# Patient Record
Sex: Female | Born: 1970 | Race: White | Hispanic: No | Marital: Married | Smoking: Never smoker
Health system: Southern US, Community
[De-identification: ages and names within clinical notes are randomized; demographics above are authoritative.]

## PROBLEM LIST (undated history)

## (undated) DIAGNOSIS — M3390 Dermatopolymyositis, unspecified, organ involvement unspecified: Secondary | ICD-10-CM

## (undated) DIAGNOSIS — M3313 Other dermatomyositis without myopathy: Secondary | ICD-10-CM

## (undated) DIAGNOSIS — A6000 Herpesviral infection of urogenital system, unspecified: Secondary | ICD-10-CM

## (undated) DIAGNOSIS — G43909 Migraine, unspecified, not intractable, without status migrainosus: Secondary | ICD-10-CM

## (undated) DIAGNOSIS — Z01419 Encounter for gynecological examination (general) (routine) without abnormal findings: Secondary | ICD-10-CM

## (undated) HISTORY — DX: Other dermatomyositis without myopathy: M33.13

## (undated) HISTORY — DX: Encounter for gynecological examination (general) (routine) without abnormal findings: Z01.419

## (undated) HISTORY — DX: Migraine, unspecified, not intractable, without status migrainosus: G43.909

## (undated) HISTORY — DX: Herpesviral infection of urogenital system, unspecified: A60.00

## (undated) HISTORY — DX: Dermatopolymyositis, unspecified, organ involvement unspecified: M33.90

## (undated) HISTORY — PX: TUBAL LIGATION: SHX77

---
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
## 2007-05-14 HISTORY — PX: APPENDECTOMY: SHX54

## 2012-03-01 LAB — HM PAP SMEAR: HM Pap smear: NORMAL

## 2012-10-23 ENCOUNTER — Ambulatory Visit (INDEPENDENT_AMBULATORY_CARE_PROVIDER_SITE_OTHER): Payer: BC Managed Care – PPO | Admitting: Family Medicine

## 2012-10-23 ENCOUNTER — Encounter: Payer: Self-pay | Admitting: Family Medicine

## 2012-10-23 VITALS — BP 120/80 | Temp 99.2°F | Ht 66.5 in | Wt 148.0 lb

## 2012-10-23 DIAGNOSIS — Z7689 Persons encountering health services in other specified circumstances: Secondary | ICD-10-CM

## 2012-10-23 DIAGNOSIS — A6000 Herpesviral infection of urogenital system, unspecified: Secondary | ICD-10-CM

## 2012-10-23 DIAGNOSIS — Z7189 Other specified counseling: Secondary | ICD-10-CM

## 2012-10-23 DIAGNOSIS — R21 Rash and other nonspecific skin eruption: Secondary | ICD-10-CM

## 2012-10-23 MED ORDER — VALACYCLOVIR HCL 1 G PO TABS: 1000.0000 mg | ORAL_TABLET | Freq: Every day | ORAL | Status: DC

## 2012-10-23 MED ORDER — KETOCONAZOLE 2 % EX SHAM: MEDICATED_SHAMPOO | CUTANEOUS | Status: DC

## 2012-10-23 MED ORDER — BETAMETHASONE VALERATE 0.12 % EX FOAM: CUTANEOUS | Status: DC

## 2012-10-23 NOTE — Progress Notes (Signed)
Chief Complaint  Patient presents with  . Establish Care  . Allergic Reaction    HPI:  Brandi Bass is here to establish care. Recently moved from [place]. Last PCP and physical: 02/2012 - had pap and physical in [place] and all was normal. Basic labs then all ok as well per her report.   Has the following chronic problems and concerns today:  Rash in neck, upper back and scalp: -skin is dry and itchy skin -tx with steroid cream which helped a little, then had prednisone shot and this helped -laundry detergent - charlies's, soap is natural olive oil glycerin soap, free and clear shampoo and conditioner -has not seen a dermatologist - has appt with derm in a few weeks  There are no active problems to display for this patient.  Health Maintenance: -UTD  ROS: See pertinent positives and negatives per HPI.  Past Medical History  Diagnosis Date  . Migraines   . Genital herpes     Family History  Problem Relation Age of Onset  . Heart disease Father 23  . Kidney disease Mother     History   Social History  . Marital Status: Married    Spouse Name: N/A    Number of Children: N/A  . Years of Education: N/A   Social History Main Topics  . Smoking status: Never Smoker   . Smokeless tobacco: None  . Alcohol Use: Yes     Comment: occ, 1 drink rarely  . Drug Use: None  . Sexually Active: Yes     Comment: with spouse   Other Topics Concern  . None   Social History Narrative   Work or School: Emergency planning/management officer for IT for [employer] Situation: lives with spouse and 2 children (6 and 2 yo in 2014)      Spiritual Beliefs: Christian      Lifestyle: she runs and goes to [employer], diet is healthy             Current outpatient prescriptions:Betamethasone Valerate (LUXIQ) 0.12 % foam, Apply twice daily, Disp: 100 g, Rfl: 0;  ketoconazole (NIZORAL) 2 % shampoo, Apply topically 2 (two) times a week., Disp: 120 mL, Rfl: 0;  triamcinolone cream (KENALOG)  0.1 %, Apply 1 application topically 2 (two) times daily. , Disp: , Rfl: ;  valACYclovir (VALTREX) 1000 MG tablet, Take 1 tablet (1,000 mg total) by mouth daily., Disp: 20 tablet, Rfl: 0  EXAM:  Filed Vitals:   10/23/12 0824  BP: 120/80  Temp: 99.2 F (37.3 C)    Body mass index is 23.53 kg/(m^2).  GENERAL: vitals reviewed and listed above, alert, oriented, appears well hydrated and in no acute distress  HEENT: atraumatic, conjunttiva clear, no obvious abnormalities on inspection of external nose and ears  NECK: no obvious masses on inspection  LUNGS: clear to auscultation bilaterally, no wheezes, rales or rhonchi, good air movement  CV: HRRR, no peripheral edema  SKIN: dry scaly skin on scalp and neck  MS: moves all extremities without noticeable abnormality  PSYCH: pleasant and cooperative, no obvious depression or anxiety  ASSESSMENT AND PLAN:  Discussed the following assessment and plan:  Encounter to establish care  Herpes genitalia - Plan: valACYclovir (VALTREX) 1000 MG tablet  Skin rash - Plan: ketoconazole (NIZORAL) 2 % shampoo, Betamethasone Valerate (LUXIQ) 0.12 % foam  -We reviewed the PMH, PSH, FH, SH, Meds and Allergies. -We provided refills for any medications we will prescribe as  needed. -We addressed current concerns per orders and patient instructions. -We have asked for records for pertinent exams, studies, vaccines and notes from previous providers. -We have advised patient to follow up per instructions below. -refilled valtrex which she uses for genital herpes outbreaks -suspect form of excema or seb derm on scalp and neck, psoriasis less likely, tx per recs and orders and she has follow up with derm. -follow up for physical when due -Patient advised to return or notify a doctor immediately if symptoms worsen or persist or new concerns arise.  Patient Instructions  For skin: -only hypoallergenic products for washing and detergent  -medicated  shampoo twice weekly  -foam steroid twice daily  -use cerave cream or Cetaphil restoraderm moisturizer daily  -follow up with your dermatologist if skin issue does not resolve  -schedule physical in November or december     Brandi Bass, [place] R.

## 2012-10-23 NOTE — Patient Instructions (Addendum)
For skin: -only hypoallergenic products for washing and detergent  -medicated shampoo twice weekly  -foam steroid twice daily  -use cerave cream or Cetaphil restoraderm moisturizer daily  -follow up with your dermatologist if skin issue does not resolve  -schedule physical in November or december

## 2012-12-16 ENCOUNTER — Telehealth: Payer: Self-pay | Admitting: Family Medicine

## 2012-12-16 NOTE — Telephone Encounter (Signed)
PT states that she seen a dermatologist and that she hasn't improved. She states that she would like to see an Allergist, like she discussed with you. Please assist.

## 2012-12-18 NOTE — Telephone Encounter (Signed)
Called and left a message for pt to return call to give information for allergist.

## 2013-01-15 ENCOUNTER — Encounter: Payer: Self-pay | Admitting: Family Medicine

## 2013-01-15 NOTE — Progress Notes (Signed)
Received OV note from Dr. Irena Cords in allergy and asthma from 01/12/13. Placed in scan box.

## 2013-01-19 ENCOUNTER — Telehealth: Payer: Self-pay | Admitting: Family Medicine

## 2013-01-19 NOTE — Telephone Encounter (Signed)
Pt states that since May she has had a breakout in her scalp and on her back.  Pt was [facility] and then by Dr Selena Batten 10/21/12.  Pt was prescribed a cream but that did not help (itch would go away until time to reapply) Nothing is taking away the rash.  Pt has also been seen by a dermatologist and allergist but she has not gotten any answers.  Pt tried Lotrimin on her scalp last night and "got the best night sleep since May".  Pt has to wash it out of her hair and is not back itching and miserable again.  Pt wants to know if maybe this is fungal and an  anti fungal medication or cream could be prescribed.  Pt states it is still the same as when she was evaluated in June but it is starting to go to her forehead and chest.  OFFICE, please follow up with pt.  [facility] is [facility] [address]

## 2013-01-19 NOTE — Telephone Encounter (Signed)
We prescribe an antifungal shampoo at the first visit. I would advise she follow up with dermatologist if symptoms have persisted this long.

## 2013-01-20 NOTE — Telephone Encounter (Signed)
Left a detailed message for pt with Dr. Elmyra Ricks recommendations.

## 2013-02-01 ENCOUNTER — Encounter: Payer: Self-pay | Admitting: Family Medicine

## 2013-02-01 ENCOUNTER — Ambulatory Visit (INDEPENDENT_AMBULATORY_CARE_PROVIDER_SITE_OTHER): Payer: BC Managed Care – PPO | Admitting: Family Medicine

## 2013-02-01 VITALS — BP 124/90 | Temp 98.6°F | Wt 143.0 lb

## 2013-02-01 DIAGNOSIS — R21 Rash and other nonspecific skin eruption: Secondary | ICD-10-CM

## 2013-02-01 NOTE — Patient Instructions (Signed)
-  call your dermatologist for appointment and follow recommendations  -all hypoallergenic products  -We placed a referral for you as discussed to [facility]. It usually takes about 1-2 weeks to process and schedule this referral. If you have not heard from [place] regarding this appointment in 2 weeks please contact our office.

## 2013-02-01 NOTE — Progress Notes (Addendum)
Chief Complaint  Patient presents with  . skin irriation    HPI:   Acute visit for Skin Rash: -ongoing for 6 months -has seen derm twice (Dr. Linus Orn) -reports had scraping: has tried topical yeast treatments, topical antifungals, steroids, ketokonazole per derm [facility] does not seem like anything has helped -mostly on face, arms, neck and chest - itchy rash -using all hypoallergenic products -is going to see allergist for patch testing too -feels fine otherwise, denies: fevers, chills, malaise, breathing issues, other symptoms  ROS: See pertinent positives and negatives per HPI.  Past Medical History  Diagnosis Date  . Migraines   . Genital herpes     Past Surgical History  Procedure Laterality Date  . Appendectomy  2009  . Tubal ligation    . Cesarean section      Family History  Problem Relation Age of Onset  . Heart disease Father 77  . Kidney disease Mother     History   Social History  . Marital Status: Married    Spouse Name: N/A    Number of Children: N/A  . Years of Education: N/A   Social History Main Topics  . Smoking status: Never Smoker   . Smokeless tobacco: None  . Alcohol Use: Yes     Comment: occ, 1 drink rarely  . Drug Use: None  . Sexual Activity: Yes     Comment: with spouse   Other Topics Concern  . 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 , Disp: , Rfl: ;  valACYclovir (VALTREX) 1000 MG tablet, Take 1 tablet (1,000 mg total) by mouth daily., Disp: 20 tablet,  Rfl: 0;  ketoconazole (NIZORAL) 2 % shampoo, Apply topically 2 (two) times a week., Disp: 120 mL, Rfl: 0  EXAM:  Filed Vitals:   02/01/13 1010  BP: 124/90  Temp: 98.6 F (37 C)    Body mass index is 22.74 kg/(m^2).  GENERAL: vitals reviewed and listed above, alert, oriented, appears well hydrated and in no acute distress  HEENT: atraumatic, conjunttiva clear, no obvious abnormalities on inspection of external nose and ears  NECK: no obvious masses on inspection  SKIN: fine erythematous papular rash in patchy distribution on chest, back, arms and hands  PSYCH: pleasant and cooperative, no obvious depression or anxiety  ASSESSMENT AND PLAN:  Discussed the following assessment and plan:  Skin rash - Plan: Ambulatory referral to Dermatology  -she is calling her dermatologist here today and will follow up with allergist -[facility] -appears to be some sore of contact dermatitis, but will defer to derm for management - she reports no treatments thus far have worked -Patient advised to return or notify a doctor immediately if symptoms worsen or persist or new concerns arise.  Patient Instructions  -call your dermatologist for appointment and follow recommendations  -all hypoallergenic products  -We placed a referral for  you as discussed to [facility]. It usually takes about 1-2 weeks to process and schedule this referral. If you have not heard from [place] regarding this appointment in 2 weeks please contact our office.      Brandi Bass R.

## 2013-02-03 ENCOUNTER — Telehealth: Payer: Self-pay

## 2013-02-03 NOTE — Telephone Encounter (Signed)
Spoke with Joni Reining and pt cannot be seen by Erline Levine until 04/21/2014 at 5:30pm.  Is it ok to wait that long?

## 2013-02-03 NOTE — Telephone Encounter (Signed)
Ok with me. Let her know it is very important to see them and would advise taking this and seeing her dermatologist in the interim.

## 2013-02-25 ENCOUNTER — Encounter: Payer: Self-pay | Admitting: Family Medicine

## 2013-02-25 NOTE — Patient Instructions (Signed)
Received office notes from [facility] from 02/22/13.  Patch test performed. Pt given ketoconazole shampoo and pt to continue betamethasone valerate foam 0.12%.  Follow up as needed.  Sent to Dr. Selena Batten to sign and then to scan box.

## 2013-03-23 ENCOUNTER — Encounter: Payer: BC Managed Care – PPO | Admitting: Family Medicine

## 2013-03-29 ENCOUNTER — Encounter: Payer: BC Managed Care – PPO | Admitting: Family Medicine

## 2015-01-23 ENCOUNTER — Ambulatory Visit (INDEPENDENT_AMBULATORY_CARE_PROVIDER_SITE_OTHER): Payer: BLUE CROSS/BLUE SHIELD | Admitting: Family Medicine

## 2015-01-23 ENCOUNTER — Encounter: Payer: Self-pay | Admitting: Family Medicine

## 2015-01-23 VITALS — BP 131/86 | HR 68 | Temp 99.2°F | Ht 66.5 in | Wt 156.0 lb

## 2015-01-23 DIAGNOSIS — A6000 Herpesviral infection of urogenital system, unspecified: Secondary | ICD-10-CM

## 2015-01-23 DIAGNOSIS — N943 Premenstrual tension syndrome: Secondary | ICD-10-CM

## 2015-01-23 DIAGNOSIS — G43829 Menstrual migraine, not intractable, without status migrainosus: Secondary | ICD-10-CM

## 2015-01-23 DIAGNOSIS — M3390 Dermatopolymyositis, unspecified, organ involvement unspecified: Secondary | ICD-10-CM | POA: Diagnosis not present

## 2015-01-23 DIAGNOSIS — G43909 Migraine, unspecified, not intractable, without status migrainosus: Secondary | ICD-10-CM | POA: Insufficient documentation

## 2015-01-23 MED ORDER — SUMATRIPTAN SUCCINATE 100 MG PO TABS: 100.0000 mg | ORAL_TABLET | ORAL | Status: AC | PRN

## 2015-01-23 NOTE — Progress Notes (Signed)
Pre visit review using our clinic review tool, if applicable. No additional management support is needed unless otherwise documented below in the visit note. Pt will get flu vaccine at work. 

## 2015-01-23 NOTE — Progress Notes (Signed)
   Subjective:    Patient ID: Brandi Bass, female    DOB: 07-11-1970, 44 y.o.   MRN: [id number]  HPI 44 yr old female to establish with [place] for primary care. 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 She and her husband moved here from [place] 3 years ago. She feels fine in general and she works out 3 days a week. Her only current issue is menstrual migraines. She had terrible migraines as a teenager but they have become much less intense over the years. They are usually associated with menses. She currently takes Aleve for them, but this does not help much. She has an extensive history of dermatomyositis which started about 5 years ago. This caused intense itchy rashes over her body as well as diffuse muscle aches. She was eventually referred to see [facility], and she was treated with a number of agents including Methotrexate and Plaquenil. These were effective but had a lot of side effects. Her symptoms gradually improved and she was taken off everything about one year ago.    Review of Systems  Constitutional: Negative.   Respiratory: Negative.   Cardiovascular: Negative.   Skin: Positive for rash.  Neurological: Positive for headaches.       Objective:   Physical Exam  Constitutional: She is oriented to person, place, and time. She appears well-developed and well-nourished.  Neck: No thyromegaly present.  Cardiovascular: Normal rate, regular rhythm, normal heart sounds and intact distal pulses.   Pulmonary/Chest: Effort normal and breath sounds normal.  Lymphadenopathy:    She has no cervical adenopathy.  Neurological: She is alert and oriented to person, place, and time. No cranial nerve deficit. Coordination normal.  Skin: No rash noted.          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 She has menstrual migraines and she will try Imitrex 100 mg prn. She has yearly wellness labwork on her job, and I asked her to  get my a copy of her most results.

## 2015-01-26 ENCOUNTER — Other Ambulatory Visit: Payer: Self-pay | Admitting: Obstetrics & Gynecology

## 2015-01-26 DIAGNOSIS — R928 Other abnormal and inconclusive findings on diagnostic imaging of breast: Secondary | ICD-10-CM

## 2015-02-02 ENCOUNTER — Ambulatory Visit
Admission: RE | Admit: 2015-02-02 | Discharge: 2015-02-02 | Disposition: A | Discharge status: Home / Self Care | Payer: BLUE CROSS/BLUE SHIELD | Source: Ambulatory Visit | Attending: Obstetrics & Gynecology | Admitting: Obstetrics & Gynecology

## 2015-02-02 DIAGNOSIS — R928 Other abnormal and inconclusive findings on diagnostic imaging of breast: Secondary | ICD-10-CM

## 2015-05-12 ENCOUNTER — Ambulatory Visit (INDEPENDENT_AMBULATORY_CARE_PROVIDER_SITE_OTHER): Payer: BLUE CROSS/BLUE SHIELD | Admitting: Family Medicine

## 2015-05-12 ENCOUNTER — Encounter: Payer: Self-pay | Admitting: Family Medicine

## 2015-05-12 VITALS — BP 152/98 | Temp 98.9°F | Ht 66.5 in | Wt 157.0 lb

## 2015-05-12 DIAGNOSIS — M542 Cervicalgia: Secondary | ICD-10-CM

## 2015-05-12 DIAGNOSIS — A6000 Herpesviral infection of urogenital system, unspecified: Secondary | ICD-10-CM

## 2015-05-12 MED ORDER — VALACYCLOVIR HCL 1 G PO TABS: 1000.0000 mg | ORAL_TABLET | ORAL | Status: DC | PRN

## 2015-05-12 MED ORDER — TRAMADOL HCL 50 MG PO TABS: 100.0000 mg | ORAL_TABLET | Freq: Four times a day (QID) | ORAL | Status: DC | PRN

## 2015-05-12 NOTE — Progress Notes (Signed)
Pre visit review using our clinic review tool, if applicable. No additional management support is needed unless otherwise documented below in the visit note. 

## 2015-05-15 ENCOUNTER — Encounter: Payer: Self-pay | Admitting: Family Medicine

## 2015-05-15 NOTE — Progress Notes (Signed)
   Subjective:    Patient ID: Brandi Bass, female    DOB: November 23, 1970, 45 y.o.   MRN: [id number]  HPI Here for one week of stiffness and pain in the neck. No recent trauma. She woke up one day with this. No radiation to the arms. Using Advil.    Review of Systems  Constitutional: Negative.   HENT: Negative.   Respiratory: Negative.   Cardiovascular: Negative.   Musculoskeletal: Positive for neck pain and neck stiffness.  Neurological: Negative.        Objective:   Physical Exam  Constitutional: She appears well-developed and well-nourished.  Neck: No thyromegaly present.  Cardiovascular: Normal rate, regular rhythm, normal heart sounds and intact distal pulses.   Pulmonary/Chest: Effort normal and breath sounds normal.  Musculoskeletal:  The posterior neck is tender with spasm and reduced ROM. Tender over the trapezei.   Lymphadenopathy:    She has no cervical adenopathy.          Assessment & Plan:  Neck pain. Try heat and Tramadol prn. Try a massage. Recheck prn

## 2015-06-05 ENCOUNTER — Encounter: Payer: Self-pay | Admitting: Family Medicine

## 2015-06-06 NOTE — Telephone Encounter (Signed)
Yes I would refill the Imitrex and use it if this happens again

## 2015-08-07 ENCOUNTER — Telehealth: Payer: Self-pay | Admitting: Family Medicine

## 2015-08-07 DIAGNOSIS — H5710 Ocular pain, unspecified eye: Secondary | ICD-10-CM

## 2015-08-07 NOTE — Telephone Encounter (Signed)
Pt would like a referral to an opthalmologist.  [facility] Pt had a broken blood vessel, but pt states it is still real painful. Please advise.

## 2015-08-08 NOTE — Telephone Encounter (Signed)
I spoke with pt and she was able to get appointment without a referral. Can you cancel referral?

## 2015-08-08 NOTE — Telephone Encounter (Signed)
Referral was done  

## 2016-12-27 IMAGING — US US BREAST LTD UNI RIGHT INC AXILLA
1 series · 9 of 9 positions shown · non-contrast
Comparison: 01/20/2015

ADDENDUM:
Previous mammogram dated 03/23/2013 from [facility] in
[facility]HOS[facility] in [place], [place], were retrieved and
reviewed. No significant changes are noted in the right breast
compared to the prior mammogram dated 03/23/2013. The final
assessment category is as follows:
CLINICAL DATA: 44-year-old patient with possible mass right breast.
She has had a prior mammogram at [REDACTED] in [HOSPITAL] [facility]OSP[facility]], which is not currently available for review.

EXAM:
DIGITAL DIAGNOSTIC RIGHT MAMMOGRAM WITH 3D TOMOSYNTHESIS
ULTRASOUND RIGHT BREAST

[Series 1: advbreast · 9 of 9 slices shown]
[im 1/9]
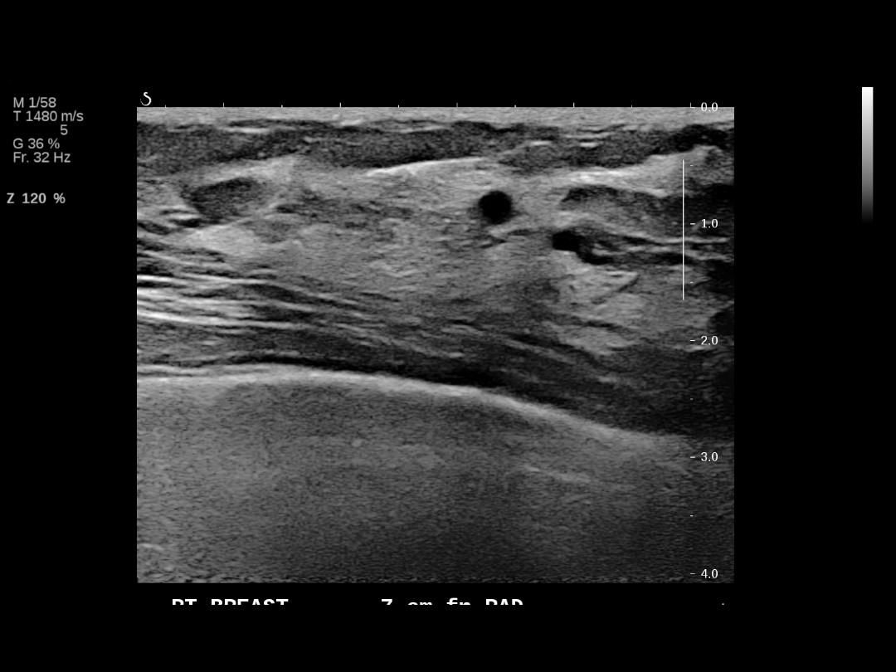
[im 2/9]
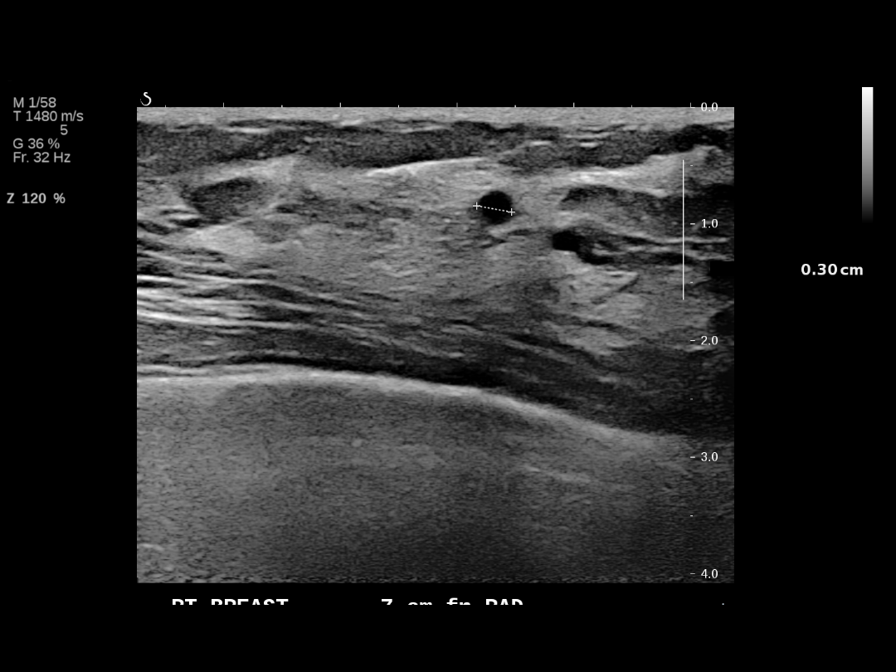
[im 3/9]
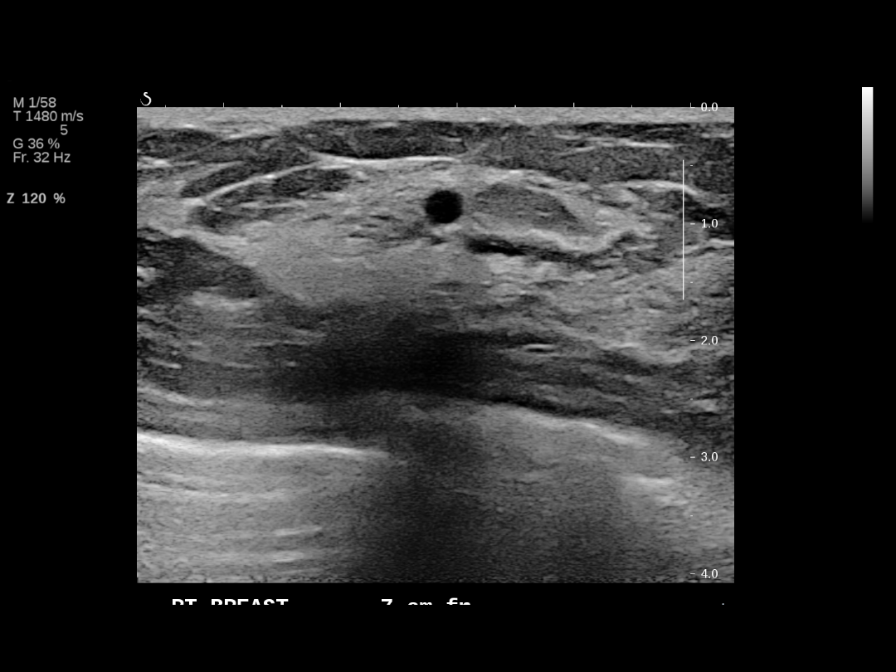
[im 4/9]
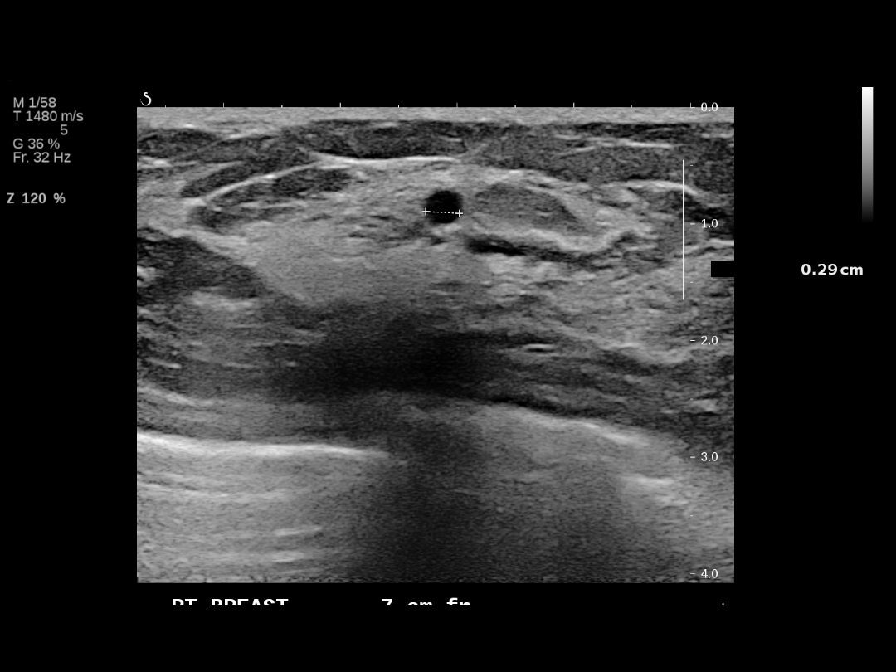
[im 5/9]
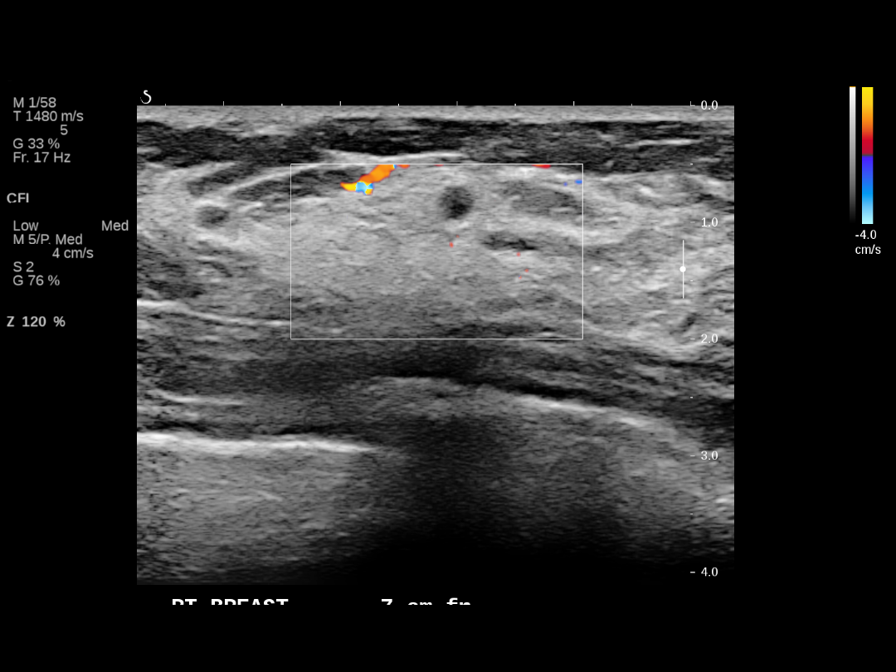
[im 6/9]
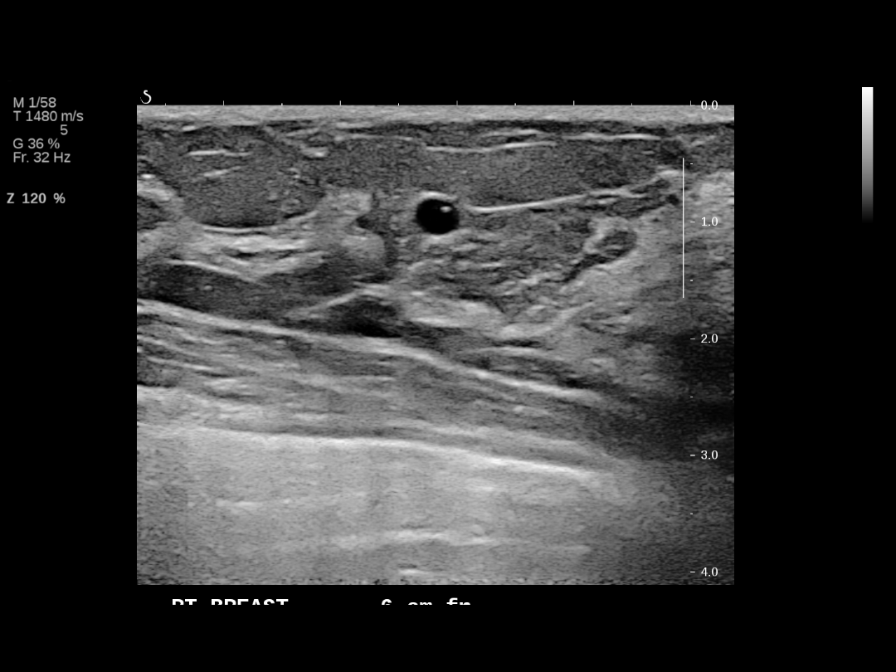
[im 7/9]
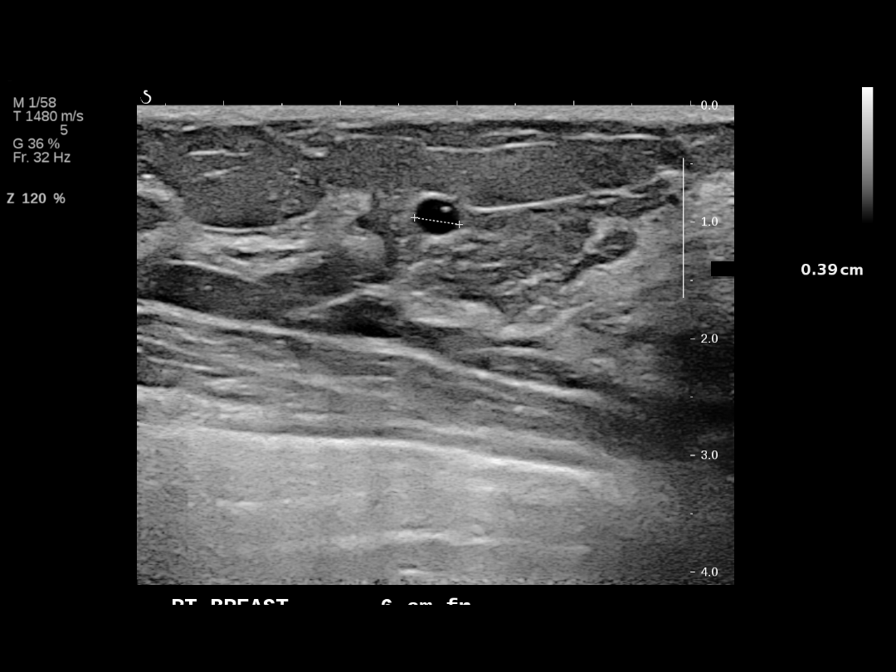
[im 8/9]
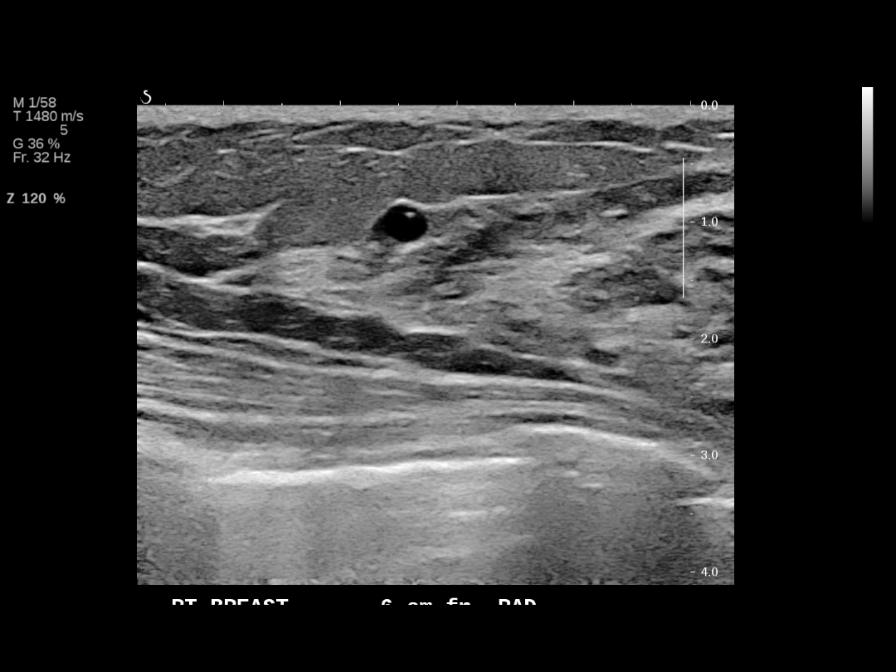
[im 9/9]
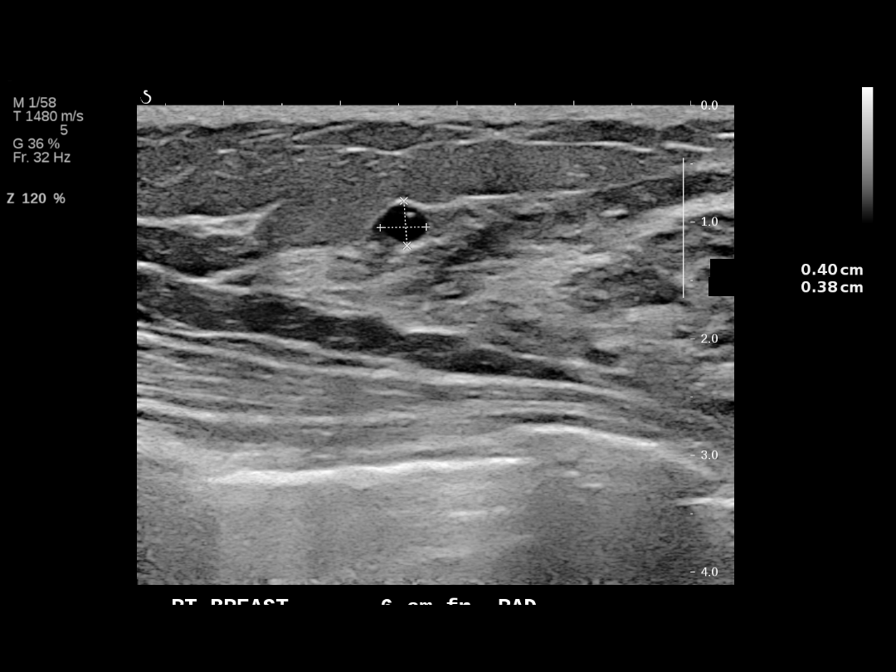

[9 of 9 positions shown; findings below may reference images not displayed]

IMPRESSION: No evidence of malignancy in the right breast. Two sub-cm cysts are
seen in the outer right breast on recent ultrasound.

RECOMMENDATION: Screening mammogram in one year.(Code:V5-F-GIO)

BI-RADS CATEGORY:

2: Benign.
ACR Breast Density Category c: The breast tissue is heterogeneously
dense, which may obscure small masses.
FINDINGS: Focal spot compression views of phase of the outer right breast with
3D technique are performed. No discrete mass is seen in the right
breast on the MLO projection. On the CC projection, there is
suggestion of a small oval nodule in the far outer right breast.

On physical exam, no mass is palpated in the upper outer lower outer
quadrant of the right breast.

Targeted ultrasound is performed, showing 2 small simple cysts are
seen on ultrasound. At 10 o'clock position 7 cm from the nipple is a
3 mm simple cyst. At 11 o'clock position 6 cm from the nipple is a 4
mm simple cyst. Ultrasound of the outer right breast shows no solid
mass or abnormal shadowing to suggest malignancy.
IMPRESSION: Two simple cysts are imaged in the upper-outer right breast, which
may account for a very small nodular density seen on one view of
today's mammogram.

RECOMMENDATION:
Our office will request prior mammograms from [facility]]
[HOSPITAL], [facility] [[facility]OSP[facility]]. Once these are received and reviewed, a
final assessment category will be addended to this report.

I have discussed the findings and recommendations with the patient.
Results were also provided in writing at the conclusion of the
visit. If applicable, a reminder letter will be sent to the patient
regarding the next appointment.

BI-RADS CATEGORY  0: Incomplete. Need additional imaging evaluation
and/or prior mammograms for comparison.

## 2016-12-27 IMAGING — MG MM DIAG BREAST TOMO UNI RIGHT
4 series · 4 of 12 positions shown · non-contrast
Comparison: 01/20/2015

ADDENDUM:
Previous mammogram dated 03/23/2013 from [facility] in
[H[facility]], [facility] in [place], [place], were retrieved and
reviewed. No significant changes are noted in the right breast
compared to the prior mammogram dated 03/23/2013. The final
assessment category is as follows:
CLINICAL DATA: 44-year-old patient with possible mass right breast.
She has had a prior mammogram at [REDACTED] in [HOSPITAL] [facility]], which is not currently available for review.

EXAM:
DIGITAL DIAGNOSTIC RIGHT MAMMOGRAM WITH 3D TOMOSYNTHESIS
ULTRASOUND RIGHT BREAST

[R CC]
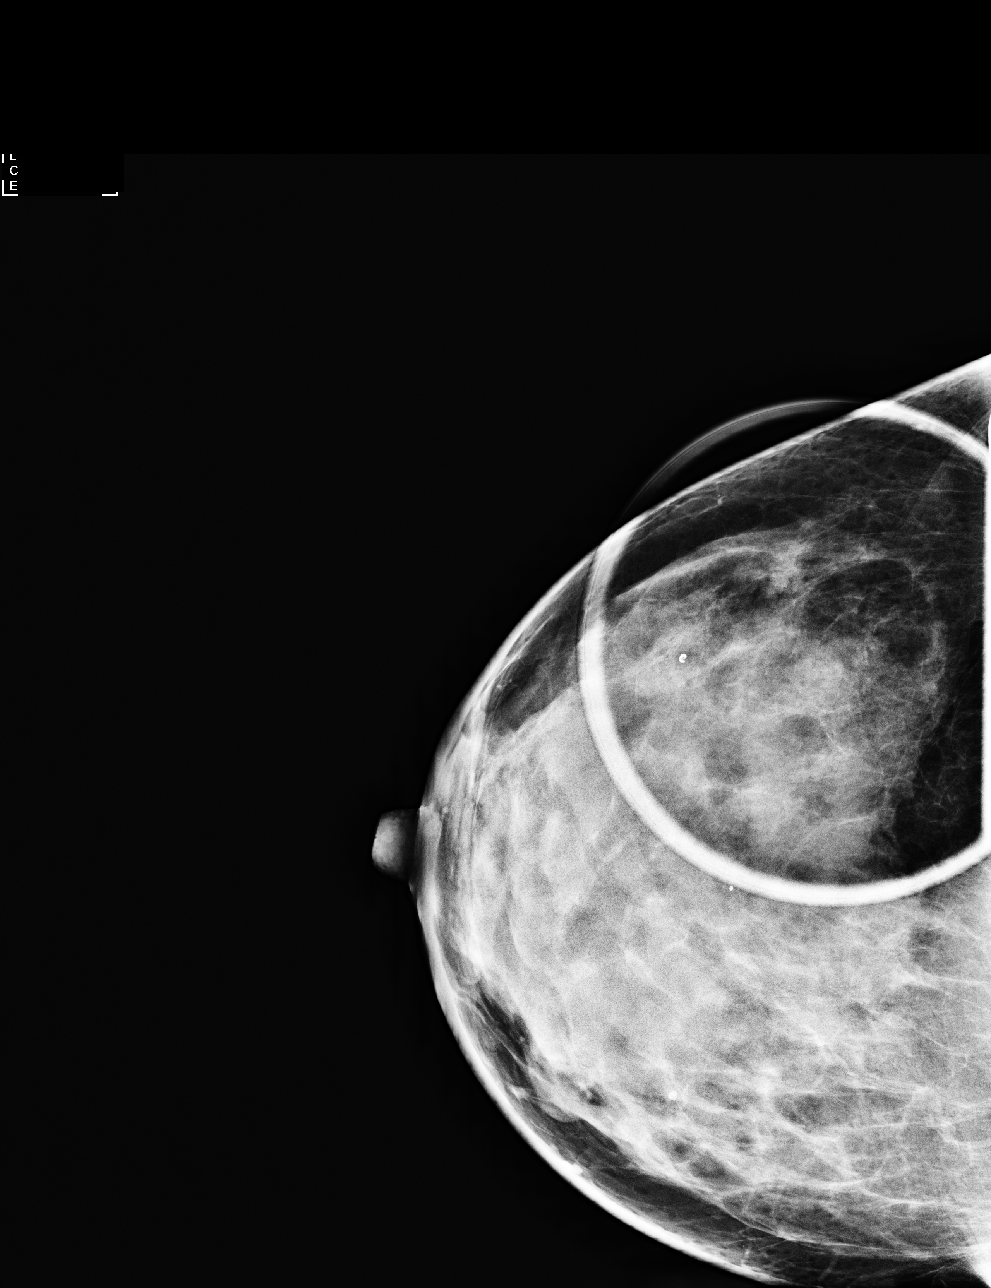

[R MLO]
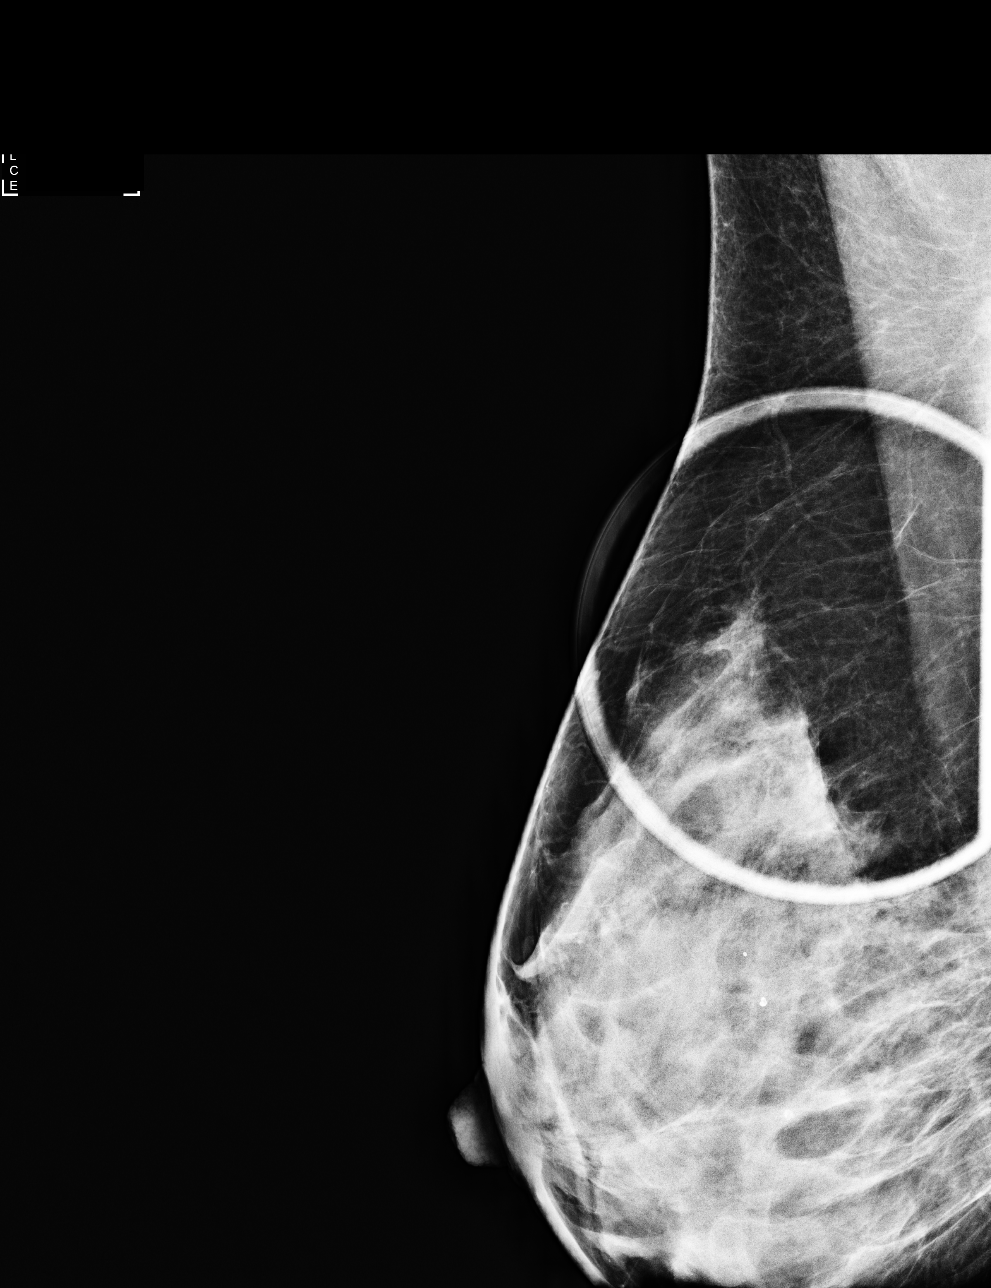

[R CC tomo · tomo slice 25/49.0]
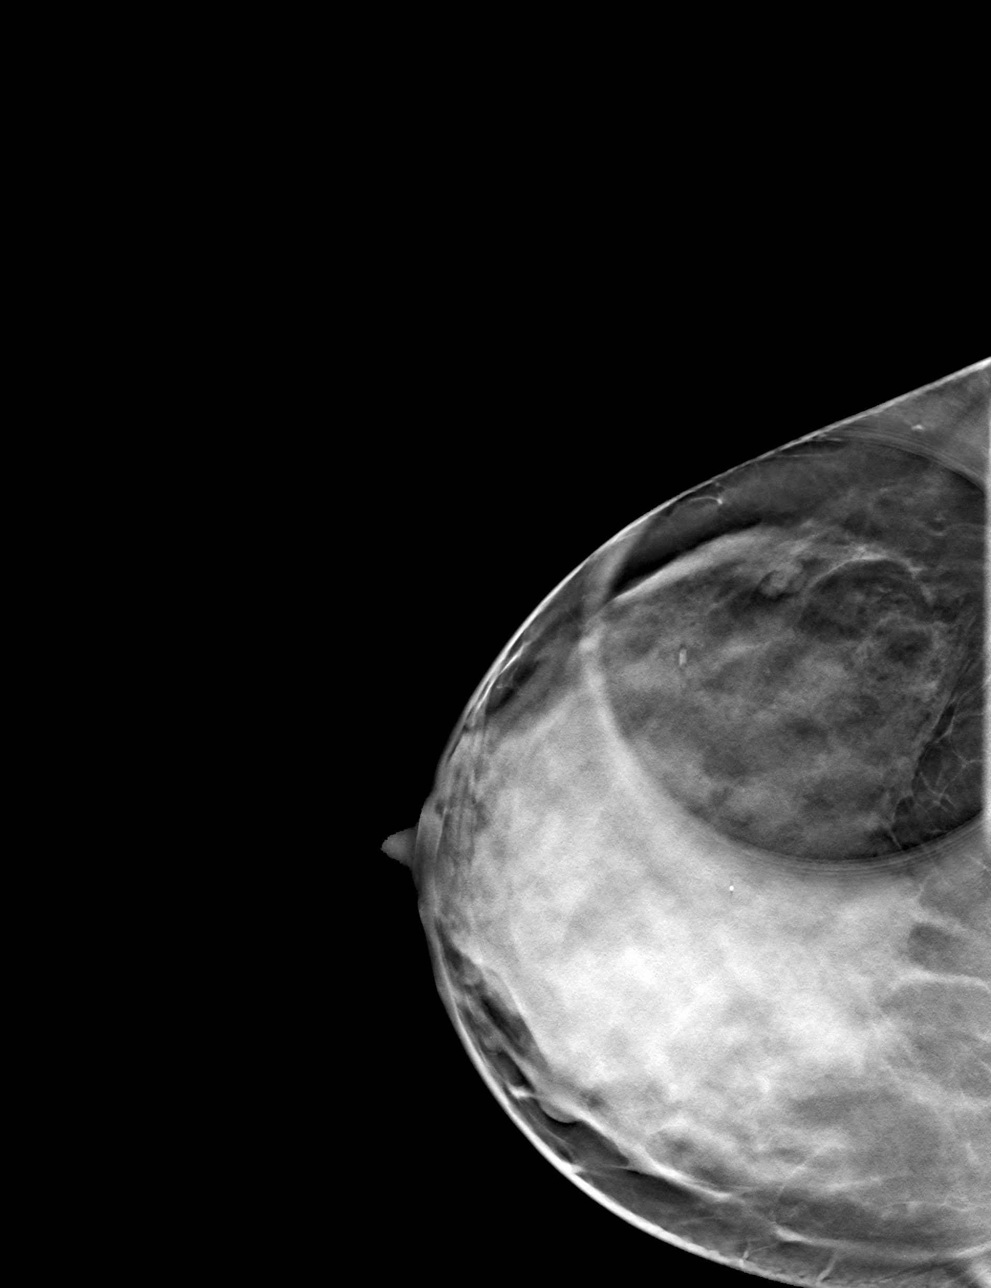

[R MLO tomo · tomo slice 29/57.0]
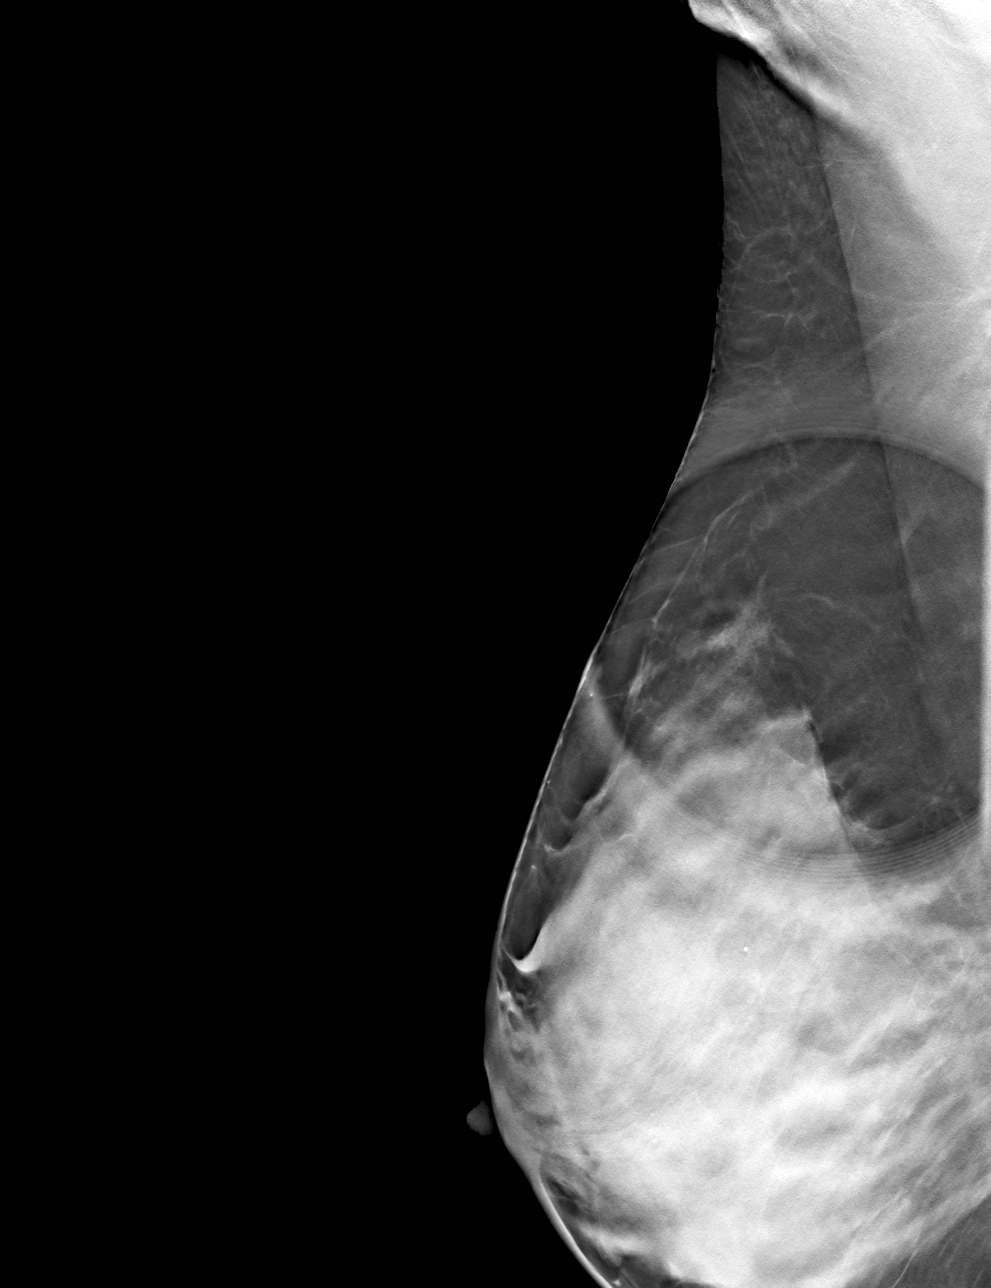

[4 of 12 positions shown; findings below may reference images not displayed]

IMPRESSION: No evidence of malignancy in the right breast. Two sub-cm cysts are
seen in the outer right breast on recent ultrasound.

RECOMMENDATION: Screening mammogram in one year.(Code:V5-F-GIO)

BI-RADS CATEGORY:

2: Benign.
ACR Breast Density Category c: The breast tissue is heterogeneously
dense, which may obscure small masses.
FINDINGS: Focal spot compression views of phase of the outer right breast with
3D technique are performed. No discrete mass is seen in the right
breast on the MLO projection. On the CC projection, there is
suggestion of a small oval nodule in the far outer right breast.

On physical exam, no mass is palpated in the upper outer lower outer
quadrant of the right breast.

Targeted ultrasound is performed, showing 2 small simple cysts are
seen on ultrasound. At 10 o'clock position 7 cm from the nipple is a
3 mm simple cyst. At 11 o'clock position 6 cm from the nipple is a 4
mm simple cyst. Ultrasound of the outer right breast shows no solid
mass or abnormal shadowing to suggest malignancy.
IMPRESSION: Two simple cysts are imaged in the upper-outer right breast, which
may account for a very small nodular density seen on one view of
today's mammogram.

RECOMMENDATION:
Our office will request prior mammograms from [facility]]
[HOSPITAL], [facility] [[facility]]. Once these are received and reviewed, a
final assessment category will be addended to this report.

I have discussed the findings and recommendations with the patient.
Results were also provided in writing at the conclusion of the
visit. If applicable, a reminder letter will be sent to the patient
regarding the next appointment.

BI-RADS CATEGORY  0: Incomplete. Need additional imaging evaluation
and/or prior mammograms for comparison.

## 2017-01-30 ENCOUNTER — Encounter: Payer: Self-pay | Admitting: Family Medicine

## 2017-04-07 ENCOUNTER — Encounter: Payer: BLUE CROSS/BLUE SHIELD | Admitting: Family Medicine

## 2017-05-01 ENCOUNTER — Ambulatory Visit (INDEPENDENT_AMBULATORY_CARE_PROVIDER_SITE_OTHER): Payer: BLUE CROSS/BLUE SHIELD | Admitting: Family Medicine

## 2017-05-01 ENCOUNTER — Encounter: Payer: Self-pay | Admitting: Family Medicine

## 2017-05-01 VITALS — BP 110/60 | HR 80 | Temp 98.6°F | Wt 162.2 lb

## 2017-05-01 DIAGNOSIS — H1133 Conjunctival hemorrhage, bilateral: Secondary | ICD-10-CM

## 2017-05-01 LAB — HEPATIC FUNCTION PANEL
ALT: 14 U/L (ref 0–35)
AST: 15 U/L (ref 0–37)
Albumin: 4.5 g/dL (ref 3.5–5.2)
Alkaline Phosphatase: 39 U/L (ref 39–117)
Bilirubin, Direct: 0.1 mg/dL (ref 0.0–0.3)
Total Bilirubin: 0.6 mg/dL (ref 0.2–1.2)
Total Protein: 7.3 g/dL (ref 6.0–8.3)

## 2017-05-01 LAB — BASIC METABOLIC PANEL
BUN: 23 mg/dL (ref 6–23)
CO2: 27 mEq/L (ref 19–32)
Calcium: 9 mg/dL (ref 8.4–10.5)
Chloride: 105 mEq/L (ref 96–112)
Creatinine, Ser: 0.83 mg/dL (ref 0.40–1.20)
GFR: 79.22 mL/min (ref >60.00)
Glucose, Bld: 91 mg/dL (ref 70–99)
Potassium: 4.5 mEq/L (ref 3.5–5.1)
Sodium: 138 mEq/L (ref 135–145)

## 2017-05-01 LAB — CBC WITH DIFFERENTIAL/PLATELET
Basophils Absolute: 0.1 10*3/uL (ref 0.0–0.1)
Basophils Relative: 0.9 % (ref 0.0–3.0)
Eosinophils Absolute: 0.2 10*3/uL (ref 0.0–0.7)
Eosinophils Relative: 3.6 % (ref 0.0–5.0)
HCT: 41.9 % (ref 36.0–46.0)
Hemoglobin: 14 g/dL (ref 12.0–15.0)
Lymphocytes Relative: 19.5 % (ref 12.0–46.0)
Lymphs Abs: 1.3 10*3/uL (ref 0.7–4.0)
MCHC: 33.6 g/dL (ref 30.0–36.0)
MCV: 89.5 fl (ref 78.0–100.0)
Monocytes Absolute: 0.4 10*3/uL (ref 0.1–1.0)
Monocytes Relative: 6 % (ref 3.0–12.0)
Neutro Abs: 4.6 10*3/uL (ref 1.4–7.7)
Neutrophils Relative %: 70 % (ref 43.0–77.0)
Platelets: 228 10*3/uL (ref 150.0–400.0)
RBC: 4.68 Mil/uL (ref 3.87–5.11)
RDW: 13.1 % (ref 11.5–15.5)
WBC: 6.5 10*3/uL (ref 4.0–10.5)

## 2017-05-01 LAB — TSH: TSH: 2.93 u[IU]/mL (ref 0.35–4.50)

## 2017-05-01 LAB — PROTIME-INR
INR: 1.1 ratio — ABNORMAL HIGH (ref 0.8–1.0)
Prothrombin Time: 11.9 s (ref 9.6–13.1)

## 2017-05-01 LAB — APTT: aPTT: 28.3 s (ref 23.4–32.7)

## 2017-05-01 NOTE — Progress Notes (Signed)
   Subjective:    Patient ID: Brandi Bass, female    DOB: 1971/03/02, 46 y.o.   MRN: [id number]  HPI Here for several months of recurrent conjunctival hemorrhages. Sometimes these are painful but usually not. No other bruising or bleeding issues. She does not wear contact lenses. She rarely takes aspirin or NSAIDs. She had been taking fish oil supplements until a few weeks ago. She notes that her mother has this problem frequently.    Review of Systems  Constitutional: Negative.   HENT: Negative.   Eyes: Positive for redness.  Respiratory: Negative.   Cardiovascular: Negative.        Objective:   Physical Exam  Constitutional: She appears well-developed and well-nourished.  HENT:  Right Ear: External ear normal.  Left Ear: External ear normal.  Nose: Nose normal.  Mouth/Throat: Oropharynx is clear and moist.  Eyes: Pupils are equal, round, and reactive to light.  Left eye is clear. Right eye has a small conjunctival hemorrhage on the nasal side  Neck: Neck supple. No thyromegaly present.  Cardiovascular: Normal rate, regular rhythm, normal heart sounds and intact distal pulses.  Pulmonary/Chest: Effort normal and breath sounds normal. No respiratory distress. She has no wheezes. She has no rales.  Lymphadenopathy:    She has no cervical adenopathy.  Skin: No rash noted. No erythema.          Assessment & Plan:  Recurrent conjunctival hemorrhages. I doubt this is a sign of any blood dyscrasias but we will screen with labs today. I advised her to avoid any omega 3 fatty acid products.  Gershon CraneStephen Jasilyn Holderman, MD

## 2017-05-15 ENCOUNTER — Encounter: Payer: BLUE CROSS/BLUE SHIELD | Admitting: Family Medicine

## 2017-10-10 ENCOUNTER — Ambulatory Visit (INDEPENDENT_AMBULATORY_CARE_PROVIDER_SITE_OTHER): Payer: BLUE CROSS/BLUE SHIELD | Admitting: Family Medicine

## 2017-10-10 ENCOUNTER — Encounter: Payer: Self-pay | Admitting: Family Medicine

## 2017-10-10 VITALS — BP 124/80 | HR 73 | Temp 98.3°F | Ht 66.5 in | Wt 163.0 lb

## 2017-10-10 DIAGNOSIS — M25561 Pain in right knee: Secondary | ICD-10-CM | POA: Diagnosis not present

## 2017-10-10 NOTE — Progress Notes (Signed)
   Subjective:    Patient ID: Brandi DonovanKathryn Bass, female    DOB: October 20, 1970, 47 y.o.   MRN: [id number]  HPI Here for one month of intermittent pain in the front of the right knee. Walking on her treadmill poses no problems but running or doing squats or lunges causes pain. No swelling. Advil helps. No hx of trauma.    Review of Systems  Constitutional: Negative.   Respiratory: Negative.   Cardiovascular: Negative.   Musculoskeletal: Positive for arthralgias.       Objective:   Physical Exam  Constitutional: She appears well-developed and well-nourished. No distress.  Cardiovascular: Normal rate, regular rhythm, normal heart sounds and intact distal pulses.  Pulmonary/Chest: Effort normal and breath sounds normal.  Musculoskeletal:  The right knee has a normal exam. No swelling, full ROM. No crepitus and no tenderness.           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 Refer to Sports Medicine to evaluate. Gershon CraneStephen Kendrew Paci, MD

## 2017-10-20 ENCOUNTER — Ambulatory Visit: Payer: BLUE CROSS/BLUE SHIELD | Admitting: Sports Medicine

## 2017-10-21 ENCOUNTER — Ambulatory Visit (INDEPENDENT_AMBULATORY_CARE_PROVIDER_SITE_OTHER): Payer: BLUE CROSS/BLUE SHIELD | Admitting: Sports Medicine

## 2017-10-21 VITALS — BP 124/90 | Ht 66.0 in | Wt 160.0 lb

## 2017-10-21 DIAGNOSIS — M2241 Chondromalacia patellae, right knee: Secondary | ICD-10-CM | POA: Diagnosis not present

## 2017-10-21 NOTE — Progress Notes (Signed)
   Subjective:    Patient ID: Brandi Bass, female    DOB: 08-24-1970, 47 y.o.   MRN: [id number]  HPI Pt reports 6 months of intermittent R knee pain since she started a [employer] to try to lose about 20lbs that she has put on over the last 1.5years. She is still carrying the extra weight. The squats, burpees and running of the boot camp made her R knee hurt behind the knee cap. This pain would usually subside in a couple of days. Prior to this she was just doing yoga and walking and had not run in about 15 years. She is no longer doing the boot camp. 2 weeks ago she was walk / jogging on her treadmill, she believes with some incline. She jogged about 10 minutes wearing [employer] running shoes and walked for 20 minutes. She began having pain in the same location again but this time it persisted for 1.5 wks. She saw her PCP who said she might have "runner's knee," and might need to change shoes, do some exercises, build up her quad, or perhaps get orthotics and referred her here. She would like to get back to exercising. No swelling or redness. Her R knee does pop at times and feels better afterwards. Her pain went away a few days ago but was a 3/10 ache with occasional sharp pains with going upstairs. She has used occasional ibuprofen and ice with no appreciable difference. Pt later added that she has been diagnosed with Dermatomyositis and is currently in remission but feels noticeably weaker overall than before her last flareup and feels like she has some muscle weakness.  Past medical history reviewed Medications reviewed Allergies reviewed    Review of Systems As above    Objective:   Physical Exam Seated on the exam table in NAD; looks to be a healthy weight but reports being heavier than her usual weight. R knee: NL inspection. Palpation does not elicit any tenderness. ROM is NL aside from a laterally tethered R patella but there is crepitus with full knee extension. Strength of  R knee is NL but R hip abductors are weak on strength testing, 4/5. Mild VMO weakness R knee is stable with neg anterior and posterior drawer signs and stable medial and lateral collateral ligaments. Negative Thessaly test. NVI. 2/2 R DP and PT pulses.       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 Will give RLE leg strengthening exercises for hip abductors, VMO (quad), and hamstring. Keep treadmill flat. Biking and swimming are good NWB exercises while working on International aid/development workerbuilding strength. Limit weightbearing exercise to no more often than QOD. F/u in the office in 6 weeks. Stop by the office in the interim with running shoes for shoe and gait analysis. If no improvement at follow-up, consider x-rays including a sunrise view to rule out patellofemoral DJD.

## 2017-12-02 ENCOUNTER — Ambulatory Visit (INDEPENDENT_AMBULATORY_CARE_PROVIDER_SITE_OTHER): Payer: BLUE CROSS/BLUE SHIELD | Admitting: Sports Medicine

## 2017-12-02 VITALS — BP 122/86 | Ht 66.0 in | Wt 162.0 lb

## 2017-12-02 DIAGNOSIS — M2241 Chondromalacia patellae, right knee: Secondary | ICD-10-CM

## 2017-12-02 NOTE — Progress Notes (Signed)
   Subjective:    Patient ID: Brandi DonovanKathryn Bass, female    DOB: 05-13-1971, 47 y.o.   MRN: [id number]  HPI Patient is a 47 year old female who presents today to follow-up on right knee pain.  Patient was seen in the office 5 weeks ago and diagnosed with chondromalacia patella.  Patient was given VMO and hip abductors home exercises that she has been doing on a basis.  Patient reports that pain in her knee has significantly improved.  She has not ran since last office visit.  She continued to cycle 4 of them now every day.  Denies any knee swelling, locking or clicking.  Patient still does have crepitus.  She currently denies using any NSAID.  No concern for her right knee today.   Review of Systems Mild right knee pain    Objective:   Physical Exam   Right knee exam: No swelling, deformity, effusion or erythema.  Mild tenderness with patellar grind.  Crepitus noted with flexion/extension. Full range of motion.  Strength 5/5 hip abductors and knee female.  Normal varus and valgus stress.  No joint laxity.  Reflexes 2+. Negative distally.  Palpable pulses neurovascular intact.     Assessment & Plan:   Follow-up for chondromalacia patella associated with hip abductor and VMO weakness, improving Patient presented with significant improvement in right knee pain in setting of chondromalacia patella.  Patient has been consistent with VMO and hip abductor exercises.  Has been cycling daily without any running and denies any patellar or knee pain.  On exam, patient has significant improvement in VMO and hip abductors strength.  At this point, recommend continuation of current exercise regimen.  Avoid squats, lunging, jumping.  Will gauge pain level prior to restarting running regimen. When patient does resume running recommendation is for flat surfaces.  Patient has neutral foot with no signs of past planus or cavus.  Will not recommend any additional support for the time being.  Follow-up on a as needed  basis.  Patient seen and evaluated with the resident. I agree with the above plan of care. Patient's knee pain has improved. Treatment as above and follow-up as needed.

## 2018-01-20 ENCOUNTER — Ambulatory Visit: Payer: Self-pay | Admitting: *Deleted

## 2018-01-20 NOTE — Telephone Encounter (Signed)
Pt called with having an elevated b/p today. Hers is normally low but today was 155/106 at the chiropractor. Hers is usually in the 110's or 120's.  Denies any symptoms. States parents have hx of HTN. Checked her bp about an hour ago and it was 149/89 and hr of 89. Appointment scheduled per protocol.  Advised to keep a check on her b/p and write them down. Bring the list to the office for her pcp to see. And to go to the  ED with any symptoms of blurred vision, headache,weakness, chest tightness or feeling fatigue. Pt voiced understanding. Reason for Disposition . [1] Systolic BP  >= 130 OR Diastolic >= 80 AND [2] not taking BP medications  Answer Assessment - Initial Assessment Questions 1. BLOOD PRESSURE: "What is the blood pressure?" "Did you take at least two measurements 5 minutes apart?"     149/89 hr 89 2. ONSET: "When did you take your blood pressure?"     About an hour ago 3. HOW: "How did you obtain the blood pressure?" (e.g., visiting nurse, automatic home BP monitor)     Automatic home BP monitor 4. HISTORY: "Do you have a history of high blood pressure?"     no 5. MEDICATIONS: "Are you taking any medications for blood pressure?" "Have you missed any doses recently?"     no 6. OTHER SYMPTOMS: "Do you have any symptoms?" (e.g., headache, chest pain, blurred vision, difficulty breathing, weakness)     no 7. PREGNANCY: "Is there any chance you are pregnant?" "When was your last menstrual period?"     No, LMP about 3 weeks ago  Protocols used: HIGH BLOOD PRESSURE-A-AH

## 2018-01-21 ENCOUNTER — Encounter: Payer: Self-pay | Admitting: Family Medicine

## 2018-01-21 ENCOUNTER — Ambulatory Visit (INDEPENDENT_AMBULATORY_CARE_PROVIDER_SITE_OTHER): Payer: BLUE CROSS/BLUE SHIELD | Admitting: Family Medicine

## 2018-01-21 VITALS — BP 140/104 | HR 96 | Temp 98.5°F | Ht 66.0 in | Wt 163.8 lb

## 2018-01-21 DIAGNOSIS — R030 Elevated blood-pressure reading, without diagnosis of hypertension: Secondary | ICD-10-CM

## 2018-01-21 NOTE — Progress Notes (Signed)
   Subjective:    Patient ID: Brandi Bass, female    DOB: [birth year], 47 y.o.   MRN: [id number] She is here with concerns about high BP readings. She has never had this before, and her BP was fine when she was here in May. However last week at her chiropractor's office they found the BP to be high. She has been checking it at home this week and she gets readings of 146-151 over 88-96. She feels fine. No swelling in the hands or feet. No recent weight changes. She does not use tobacco. She will start a new job next week which she is excited about but she admits to feeling very stressed. Both her parents had HTN. She had normal lab work here in January.   Review of Systems  Constitutional: Negative.   Respiratory: Negative.   Cardiovascular: Negative.   Neurological: Negative.        Objective:   Physical Exam  Constitutional: She is oriented to person, place, and time. She appears well-developed and well-nourished.  Neck: No thyromegaly present.  Cardiovascular: Normal rate, regular rhythm, normal heart sounds and intact distal pulses.  Pulmonary/Chest: Effort normal and breath sounds normal.  Musculoskeletal: She exhibits no edema.  Lymphadenopathy:    She has no cervical adenopathy.  Neurological: She is alert and oriented to person, place, and time.          Assessment & Plan:  She has had elevated BP readings this past week, and I feel her stress levels are contributing. We discussed reducing the sodium intake in her diet. She will exercise daily, either by walking or riding her cycle machine. She will monitor the BP at home and recheck with [place] in one month.  Gershon Crane, MD

## 2018-03-23 ENCOUNTER — Ambulatory Visit (INDEPENDENT_AMBULATORY_CARE_PROVIDER_SITE_OTHER): Payer: BLUE CROSS/BLUE SHIELD | Admitting: Family Medicine

## 2018-03-23 ENCOUNTER — Ambulatory Visit: Payer: Self-pay

## 2018-03-23 ENCOUNTER — Encounter: Payer: Self-pay | Admitting: Family Medicine

## 2018-03-23 VITALS — BP 138/80 | HR 83 | Temp 98.3°F | Wt 164.1 lb

## 2018-03-23 DIAGNOSIS — H1132 Conjunctival hemorrhage, left eye: Secondary | ICD-10-CM | POA: Diagnosis not present

## 2018-03-23 NOTE — Telephone Encounter (Signed)
Pt called with report of bleeding form left eye. Pt states she has appointment tomorrow to see Dr Clent Ridges. Pt states that Dr Clent Ridges has treated this for her in the past.   She is bleeding from the eye which has never happened  and needs to see the Dr today. Pt states she is able to see out of the eye and has no pain just feels weird. She has been diagnosed in the past with a conjunctival hemorrhage but never did it bleed like this. Per protocol pt needed appointment today. I spoke with Cecil Cranker at [facility] and transferred the call to their office per [facility] request. Care advice not given to pt because of the transfer of the call.   Reason for Disposition . MODERATE eye pain (e.g., interferes with normal activities)  Answer Assessment - Initial Assessment Questions 1. EYE DISCHARGE: "Is the discharge in one or both eyes?" "What color is it?" "How much is there?" "When did the discharge start?"      Red blood 2. REDNESS OF SCLERA: "Is the redness in one or both eyes?" "When did the redness start?"      Top of left eye 3. EYELIDS: "Are the eyelids red or swollen?" If so, ask: "How much?"      no 4. VISION: "Is there any difficulty seeing clearly?"      Feels weird but can see 5. PAIN: "Is there any pain? If so, ask: "How bad is it?" (Scale 1-10; or mild, moderate, severe)    - MILD (1-3): doesn't interfere with normal activities     - MODERATE (4-7): interferes with normal activities or awakens from sleep    - SEVERE (8-10): excruciating pain, unable to do any normal activities       no 6. CONTACT LENS: "Do you wear contacts?"     no 7. OTHER SYMPTOMS: "Do you have any other symptoms?" (e.g., fever, runny nose, cough)     No 8. PREGNANCY: "Is there any chance you are pregnant?" "When was your last menstrual period?"     No stared on thursday  Protocols used: EYE - PUS OR DISCHARGE-A-AH

## 2018-03-23 NOTE — Progress Notes (Signed)
   Subjective:    Patient ID: Brandi Bass, female    DOB: Oct 18, 1970, 47 y.o.   MRN: [id number]  HPI Here for 2 days of mild pain in the left eye with a subconjunctival hemorrhage and now with actual seeling of blood from the eye. No change in vision. No recent trauma. No symptoms of URI. She was seen here last December for simultaneous bilateral subconjunctival hemorrhages also. At that time we did some labs to check PT, PTT, etc which were normal. She seldom uses NSAIDs.  She used to take omega 3 fatty acid supplements but she stopped this over a month ago. She never has any other trouble with bruising or easy bleeding.    Review of Systems  Constitutional: Negative.   HENT: Negative.   Eyes: Positive for pain and redness.  Respiratory: Negative.   Neurological: Negative.   Hematological: Does not bruise/bleed easily.       Objective:   Physical Exam  Constitutional: She appears well-developed and well-nourished.  HENT:  Right Ear: External ear normal.  Left Ear: External ear normal.  Nose: Nose normal.  Mouth/Throat: Oropharynx is clear and moist.  Eyes: Pupils are equal, round, and reactive to light. EOM are normal.  The left eye has extensive subconjunctival hemorrhage and there is a small thrombus under the conjunctiva on the lateral side of the eye. A small amount of external blood is present in the corner of the eye   Neck: Neck supple. No thyromegaly present.  Pulmonary/Chest: Effort normal and breath sounds normal. No stridor. No respiratory distress. She has no wheezes. She has no rales.  Lymphadenopathy:    She has no cervical adenopathy.  Skin:  No evidence of bruising           Assessment & Plan:  Recurrent subconjunctival bleeding. She may apply Refresh eye drops for comfort. Refer to Ophthalmology.  Gershon Crane, MD

## 2018-03-23 NOTE — Telephone Encounter (Signed)
Pt scheduled to see Dr Fry today.  

## 2018-03-24 ENCOUNTER — Ambulatory Visit: Payer: Self-pay | Admitting: Family Medicine

## 2018-03-24 DIAGNOSIS — Z0289 Encounter for other administrative examinations: Secondary | ICD-10-CM

## 2018-03-26 ENCOUNTER — Telehealth: Payer: Self-pay | Admitting: Family Medicine

## 2018-03-26 NOTE — Telephone Encounter (Signed)
Copied from CRM [phone]. Topic: Quick Communication - See Telephone Encounter >> Mar 26, 2018 11:29 AM Jolayne Hainesaylor, Brittany L wrote: CRM for notification. See Telephone encounter for: 03/26/18.  Patient said she seen Dr Elmer PickerHecker today at [facility] and she suggested a CBC along with some other test she is not sure of. She said her blood pressure was elevated at that appointment, it was 140/100. Please contact patient.

## 2018-03-27 NOTE — Telephone Encounter (Signed)
Left message on machine for copy of Dr Hecker's last office note.

## 2018-03-27 NOTE — Telephone Encounter (Signed)
Please see if we can get Dr. Deirdre EvenerHecker's note faxed to us today. Then we can discuss it with the patient

## 2018-03-28 ENCOUNTER — Encounter: Payer: Self-pay | Admitting: Family Medicine

## 2018-03-30 NOTE — Telephone Encounter (Signed)
Dr. Fry please advise. Thanks  

## 2018-04-01 ENCOUNTER — Encounter: Payer: Self-pay | Admitting: Family Medicine

## 2018-04-01 NOTE — Telephone Encounter (Signed)
Dr. Fry please advise. Thanks  

## 2018-04-02 MED ORDER — LISINOPRIL 10 MG PO TABS: 10.0000 mg | ORAL_TABLET | Freq: Every day | ORAL | 3 refills | Status: DC

## 2018-04-02 NOTE — Telephone Encounter (Signed)
We checked the clotting ability of her blood earlier this year and it was normal. I think we should start her on a mild BP medication so call in Lisinopril 10 mg daily, #30 with 3 rf. Have her follow up with me in 3 weeks.

## 2018-04-13 ENCOUNTER — Encounter: Payer: Self-pay | Admitting: Family Medicine

## 2018-04-13 NOTE — Telephone Encounter (Signed)
Dr. Fry please advise. Thanks  

## 2018-04-14 NOTE — Telephone Encounter (Signed)
Yes tell her to take 1/2 tablet of Lisinopril every day for a week or two

## 2018-04-24 ENCOUNTER — Ambulatory Visit: Payer: BLUE CROSS/BLUE SHIELD | Admitting: Family Medicine

## 2018-04-30 ENCOUNTER — Ambulatory Visit (INDEPENDENT_AMBULATORY_CARE_PROVIDER_SITE_OTHER): Payer: BLUE CROSS/BLUE SHIELD | Admitting: Family Medicine

## 2018-04-30 ENCOUNTER — Encounter: Payer: Self-pay | Admitting: Family Medicine

## 2018-04-30 VITALS — BP 112/78 | HR 60 | Temp 98.4°F | Wt 161.1 lb

## 2018-04-30 DIAGNOSIS — I10 Essential (primary) hypertension: Secondary | ICD-10-CM

## 2018-04-30 DIAGNOSIS — A6000 Herpesviral infection of urogenital system, unspecified: Secondary | ICD-10-CM

## 2018-04-30 LAB — CBC WITH DIFFERENTIAL/PLATELET
Basophils Absolute: 0 10*3/uL (ref 0.0–0.1)
Basophils Relative: 0.6 % (ref 0.0–3.0)
Eosinophils Absolute: 0.2 10*3/uL (ref 0.0–0.7)
Eosinophils Relative: 2.5 % (ref 0.0–5.0)
HCT: 41.9 % (ref 36.0–46.0)
Hemoglobin: 14.4 g/dL (ref 12.0–15.0)
Lymphocytes Relative: 21.1 % (ref 12.0–46.0)
Lymphs Abs: 1.6 10*3/uL (ref 0.7–4.0)
MCHC: 34.4 g/dL (ref 30.0–36.0)
MCV: 86.6 fl (ref 78.0–100.0)
Monocytes Absolute: 0.5 10*3/uL (ref 0.1–1.0)
Monocytes Relative: 6.4 % (ref 3.0–12.0)
Neutro Abs: 5.3 10*3/uL (ref 1.4–7.7)
Neutrophils Relative %: 69.4 % (ref 43.0–77.0)
Platelets: 236 10*3/uL (ref 150.0–400.0)
RBC: 4.84 Mil/uL (ref 3.87–5.11)
RDW: 13.1 % (ref 11.5–15.5)
WBC: 7.6 10*3/uL (ref 4.0–10.5)

## 2018-04-30 LAB — POC URINALSYSI DIPSTICK (AUTOMATED)
Bilirubin, UA: NEGATIVE
Blood, UA: POSITIVE
Glucose, UA: NEGATIVE
Ketones, UA: NEGATIVE
Leukocytes, UA: NEGATIVE
Nitrite, UA: NEGATIVE
Protein, UA: NEGATIVE
Spec Grav, UA: 1.01 (ref 1.010–1.025)
Urobilinogen, UA: 0.2 E.U./dL
pH, UA: 6 (ref 5.0–8.0)

## 2018-04-30 LAB — TSH: TSH: 2.5 u[IU]/mL (ref 0.35–4.50)

## 2018-04-30 LAB — BASIC METABOLIC PANEL
BUN: 22 mg/dL (ref 6–23)
CO2: 27 mEq/L (ref 19–32)
Calcium: 9.7 mg/dL (ref 8.4–10.5)
Chloride: 103 mEq/L (ref 96–112)
Creatinine, Ser: 0.88 mg/dL (ref 0.40–1.20)
GFR: 73.72 mL/min (ref >60.00)
Glucose, Bld: 90 mg/dL (ref 70–99)
Potassium: 4.8 mEq/L (ref 3.5–5.1)
Sodium: 137 mEq/L (ref 135–145)

## 2018-04-30 LAB — HEPATIC FUNCTION PANEL
ALT: 16 U/L (ref 0–35)
AST: 16 U/L (ref 0–37)
Albumin: 4.7 g/dL (ref 3.5–5.2)
Alkaline Phosphatase: 39 U/L (ref 39–117)
Bilirubin, Direct: 0.1 mg/dL (ref 0.0–0.3)
Total Bilirubin: 0.9 mg/dL (ref 0.2–1.2)
Total Protein: 7.4 g/dL (ref 6.0–8.3)

## 2018-04-30 LAB — LIPID PANEL
Cholesterol: 234 mg/dL — ABNORMAL HIGH (ref 0–200)
HDL: 63 mg/dL (ref >39.00)
LDL Cholesterol: 149 mg/dL — ABNORMAL HIGH (ref 0–99)
NonHDL: 170.85
Total CHOL/HDL Ratio: 4
Triglycerides: 109 mg/dL (ref 0.0–149.0)
VLDL: 21.8 mg/dL (ref 0.0–40.0)

## 2018-04-30 MED ORDER — VALACYCLOVIR HCL 1 G PO TABS: 1000.0000 mg | ORAL_TABLET | ORAL | 5 refills | Status: AC | PRN

## 2018-04-30 MED ORDER — LISINOPRIL 5 MG PO TABS: 5.0000 mg | ORAL_TABLET | Freq: Every day | ORAL | 3 refills | Status: DC

## 2018-04-30 NOTE — Progress Notes (Signed)
   Subjective:    Patient ID: Brandi DonovanKathryn Bass, female    DOB: 24-Jun-1970, 47 y.o.   MRN: [id number]  HPI Here to follow up on BP. She has been taking 1/2 of a Linsinoopril tablet a day and this has been working very well. She feels great and the BP has been stable. She has had no more conjunctival hemorrhages either.    Review of Systems  Constitutional: Negative.   Eyes: Negative.   Respiratory: Negative.   Cardiovascular: Negative.   Neurological: Negative.        Objective:   Physical Exam Constitutional:      Appearance: Normal appearance.  Eyes:     Extraocular Movements: Extraocular movements intact.     Conjunctiva/sclera: Conjunctivae normal.     Pupils: Pupils are equal, round, and reactive to light.  Cardiovascular:     Rate and Rhythm: Normal rate and regular rhythm.     Pulses: Normal pulses.     Heart sounds: Normal heart sounds.  Pulmonary:     Effort: Pulmonary effort is normal.     Breath sounds: Normal breath sounds.  Neurological:     General: No focal deficit present.     Mental Status: She is alert and oriented to person, place, and time.           Assessment & Plan:  Her HTN is well controlled on Lisinopril 5 mg daily. Get fasting labs today.  Gershon CraneStephen Delray Reza, MD

## 2018-05-04 ENCOUNTER — Encounter: Payer: Self-pay | Admitting: *Deleted

## 2018-05-25 ENCOUNTER — Encounter: Payer: Self-pay | Admitting: Family Medicine

## 2018-05-25 ENCOUNTER — Ambulatory Visit (INDEPENDENT_AMBULATORY_CARE_PROVIDER_SITE_OTHER): Payer: BLUE CROSS/BLUE SHIELD | Admitting: Family Medicine

## 2018-05-25 VITALS — BP 110/78 | HR 86 | Temp 98.6°F | Ht 66.0 in | Wt 162.0 lb

## 2018-05-25 DIAGNOSIS — J029 Acute pharyngitis, unspecified: Secondary | ICD-10-CM | POA: Diagnosis not present

## 2018-05-25 LAB — POCT RAPID STREP A (OFFICE): Rapid Strep A Screen: NEGATIVE

## 2018-05-25 MED ORDER — IPRATROPIUM BROMIDE 0.06 % NA SOLN: 2.0000 | Freq: Four times a day (QID) | NASAL | 0 refills | Status: AC

## 2018-05-25 NOTE — Patient Instructions (Signed)
Start the atrovent.  Please stay well hydrated.  You can take tylenol and/or motrin as needed for low grade fever and pain.  Please let me know if your symptoms worsen or fail to improve.  Take care, Dr Epifanio Labrador  

## 2018-05-25 NOTE — Progress Notes (Signed)
   Subjective:  Brandi Bass is a 48 y.o. female who presents today for same-day appointment with a chief complaint of sore throat.   HPI:  Sore Throat, Acute problem Started 3 days ago. Improving over that time. Associated with rhinorrhea and sinus congestion. Some subjective fevers and chills. No cough. No sputum production. Several sick contacts at work. Tried taking coricidin with modest improvement. No other obvious alleviating or aggravating factors.   ROS: Per HPI  PMH: She reports that she has never smoked. She has never used smokeless tobacco. She reports current alcohol use. She reports that she does not use drugs.  Objective:  Physical Exam: BP 110/78 (BP Location: Left Arm, Patient Position: Sitting, Cuff Size: Normal)   Pulse 86   Temp 98.6 F (37 C) (Oral)   Ht 5\' 6"  (1.676 m)   Wt 162 lb (73.5 kg)   LMP 05/09/2018   SpO2 94%   BMI 26.15 kg/m   Gen: NAD, resting comfortably HEENT: TMs with clear effusion.  OP erythematous with no exudate.  Nasal mucosa erythematous and boggy with clear discharge. CV: RRR with no murmurs appreciated Pulm: NWOB, CTAB with no crackles, wheezes, or rhonchi  Assessment/Plan:  Sorethroat Likely secondary to viral URI. No signs of bacterial infection.  Rapid strep negative.  Start atrovent for rhinorrhea/sinus congestion. Recommended tylenol and/or motrin as needed for low grade fever and pain. Encouraged good oral hydration. Return precautions reviewed. Follow up as needed.   Katina Degree. Jimmey Ralph, MD 05/25/2018 1:22 PM

## 2018-09-24 ENCOUNTER — Other Ambulatory Visit: Payer: Self-pay

## 2018-09-24 ENCOUNTER — Ambulatory Visit (INDEPENDENT_AMBULATORY_CARE_PROVIDER_SITE_OTHER): Payer: BLUE CROSS/BLUE SHIELD | Admitting: Family Medicine

## 2018-09-24 ENCOUNTER — Encounter: Payer: Self-pay | Admitting: Family Medicine

## 2018-09-24 DIAGNOSIS — K219 Gastro-esophageal reflux disease without esophagitis: Secondary | ICD-10-CM | POA: Diagnosis not present

## 2018-09-24 MED ORDER — OMEPRAZOLE 40 MG PO CPDR: 40.0000 mg | DELAYED_RELEASE_CAPSULE | Freq: Every day | ORAL | 5 refills | Status: AC

## 2018-09-24 NOTE — Progress Notes (Signed)
Subjective:    Patient ID: Brandi Bass, female    DOB: May 29, 1970, 48 y.o.   MRN: [id number]  HPI Virtual Visit via Video Note  I connected with the patient on 09/24/18 at 11:30 AM EDT by a video enabled telemedicine application and verified that I am speaking with the correct person using two identifiers.  Location patient: home Location provider:work or home office Persons participating in the virtual visit: patient, provider  I discussed the limitations of evaluation and management by telemedicine and the availability of in person appointments. The patient expressed understanding and agreed to proceed.   HPI: Here for possible GERD. Over the past few months she has had frequent heartburn and she often has reflux of sour tasting fluids come up into the back of her throat. She often coughs when this happens. No ST or trouble swallowing. She gets nauseated at times but has not vomited. She tried OTC Omeprazole for a week and this helped a great deal.    ROS: See pertinent positives and negatives per HPI.  Past Medical History:  Diagnosis Date  . Dermatomyositis (HCC)    saw Dr. Reche Dixon at [facility]   . Encounter for gynecological examination    sees Wardell Honour NP  . Genital herpes   . Migraines     Past Surgical History:  Procedure Laterality Date  . APPENDECTOMY  2009  . CESAREAN SECTION     in 2007 and 2011   . TUBAL LIGATION    . URETHRA SURGERY  1978    Family History  Problem Relation Age of Onset  . Heart disease Father 54  . Kidney disease Mother      Current Outpatient Medications:  .  cetirizine (ZYRTEC ALLERGY) 10 MG tablet, Take 10 mg by mouth daily., Disp: , Rfl:  .  fluticasone (FLONASE) 50 MCG/ACT nasal spray, Place into both nostrils daily., Disp: , Rfl:  .  ipratropium (ATROVENT) 0.06 % nasal spray, Place 2 sprays into both nostrils 4 (four) times daily., Disp: 15 mL, Rfl: 0 .  lisinopril (PRINIVIL,ZESTRIL) 5 MG tablet, Take 1  tablet (5 mg total) by mouth daily., Disp: 90 tablet, Rfl: 3 .  SUMAtriptan (IMITREX) 100 MG tablet, Take 1 tablet (100 mg total) by mouth as needed for migraine. May repeat in 2 hours if headache persists or recurs., Disp: 10 tablet, Rfl: 11 .  valACYclovir (VALTREX) 1000 MG tablet, Take 1 tablet (1,000 mg total) by mouth as needed., Disp: 30 tablet, Rfl: 5 .  omeprazole (PRILOSEC) 40 MG capsule, Take 1 capsule (40 mg total) by mouth daily., Disp: 30 capsule, Rfl: 5  EXAM:  VITALS per patient if applicable:  GENERAL: alert, oriented, appears well and in no acute distress  HEENT: atraumatic, conjunttiva clear, no obvious abnormalities on inspection of external nose and ears  NECK: normal movements of the head and neck  LUNGS: on inspection no signs of respiratory distress, breathing rate appears normal, no obvious gross SOB, gasping or wheezing  CV: no obvious cyanosis  MS: moves all visible extremities without noticeable abnormality  PSYCH/NEURO: pleasant and cooperative, no obvious depression or anxiety, speech and thought processing grossly intact  ASSESSMENT AND PLAN: GERD, treat with Omeprazole 40 mg daily. Recheck prn.  Gershon Crane, MD  Discussed the following assessment and plan:  No diagnosis found.     I discussed the assessment and treatment plan with the patient. The patient was provided an opportunity to ask questions and all were answered.  The patient agreed with the plan and demonstrated an understanding of the instructions.   The patient was advised to call back or seek an in-person evaluation if the symptoms worsen or if the condition fails to improve as anticipated.     Review of Systems     Objective:   Physical Exam        Assessment & Plan:

## 2018-10-06 ENCOUNTER — Ambulatory Visit: Payer: Self-pay

## 2018-10-06 NOTE — Telephone Encounter (Signed)
Patient called and says she was seen in the office on 09/24/18 or nausea/abdominal pain, was diagnosed with acid reflux and prescribed omeprazole 40 mg daily to take. She says she's been taking it and her symptoms have gotten much better, but she's still on the brat diet, still with mild nausea, still with mild abdominal pain right under the ribs at a 1-2. She says she doesn't have any new symptoms. She says she forgot to ask how long will it take for her symptoms to go away and get back to her normal eating. I advised it could take up to 4 weeks or longer for the symptoms to go away completely with the medication, depending on the severity. I advised I will send this to Dr. Clent Ridges and someone will call with this recommendation.  Answer Assessment - Initial Assessment Questions 1. MAIN CONCERN OR SYMPTOM:  "What is your main concern right now?" "What question do you have?" "What's the main symptom you're worried about?" (e.g., breathing difficulty, cough, fever. pain)     Nausea (mild), eating mild food chicken and rice, mid upper abdominal pain below ribs 1 or 2 2. ONSET: "When did the symptoms start?"     N/A 3. BETTER-SAME-WORSE: "Are you getting better, staying the same, or getting worse compared to how you felt at your last visit to the doctor (most recent medical visit)"?     Same 4. VISIT DATE: "When were you seen?" (Date)     09/24/18 5. VISIT DOCTOR: "What is the name of the doctor taking care of you now?"     Dr. Clent Ridges 6. VISIT DIAGNOSIS:  "What was the main symptom or problem that you were seen for?" "Were you given a diagnosis?"      Nausea/abdominal pain. Diagnosis acid reflux and placed on medication 7. VISIT MEDICATIONS: "Did the physician order any new medicines for you to use?" If yes: "Have you filled the prescription and started taking the medicine?"      Omeprazole, started taking it 8. NEXT APPOINTMENT: "Have you scheduled a follow-up appointment with your doctor?"    No, just let him  know if symptoms are not getting better. 9. PAIN: "Is there any pain?" If so, ask: "How bad is it?"  (Scale 1-10; or mild, moderate, severe)     1-2 10. FEVER: "Do you have a fever?" If so, ask: "What is it, how was it measured  and when did it start?"      N/A 11. OTHER SYMPTOMS: "Do you have any other symptoms?"      No  Protocols used: RECENT MEDICAL VISIT FOR ILLNESS FOLLOW-UP CALL-A-AH

## 2018-10-07 NOTE — Telephone Encounter (Signed)
Called and spoke with pt and she is aware of Dr. Claris Che recs.  She will try this for a week and call us back to let us know how this works for her.

## 2018-10-07 NOTE — Telephone Encounter (Signed)
Tell her to try taking the Omeprazole BID (on an empty stomach before breakfast and before dinner) for a week and see if this helps

## 2018-10-23 ENCOUNTER — Telehealth: Payer: Self-pay | Admitting: Family Medicine

## 2018-10-23 NOTE — Telephone Encounter (Signed)
Copied from [facility] [id number]. Topic: Quick Communication - Rx Refill/Question >> Oct 23, 2018  3:00 PM Rayann Heman wrote: Medication: omeprazole (PRILOSEC) 40 MG capsule [[id number]] pt called and stated that she would like to know if medication MG needs to be lowered. Please advise

## 2018-10-23 NOTE — Telephone Encounter (Signed)
Dr. Fry please advise. Thanks  

## 2018-10-26 NOTE — Telephone Encounter (Signed)
I would rather she take the 40 mg for now

## 2018-10-28 NOTE — Telephone Encounter (Signed)
lmomtcb x1 

## 2018-10-29 NOTE — Telephone Encounter (Signed)
Pt called back and was advised to take the 40mg 

## 2018-11-26 ENCOUNTER — Encounter: Payer: Self-pay | Admitting: Family Medicine

## 2018-12-01 NOTE — Telephone Encounter (Signed)
Try taking the Omeprazole in the mornings and add Pepcid OTC in the evenings

## 2019-08-07 ENCOUNTER — Other Ambulatory Visit: Payer: Self-pay | Admitting: Family Medicine
